# Patient Record
Sex: Female | Born: 1990 | Race: Black or African American | Hispanic: No | Smoking: Never smoker
Health system: Southern US, Community
[De-identification: ages and names within clinical notes are randomized; demographics above are authoritative.]

## PROBLEM LIST (undated history)

## (undated) DIAGNOSIS — F419 Anxiety disorder, unspecified: Secondary | ICD-10-CM

## (undated) DIAGNOSIS — J45909 Unspecified asthma, uncomplicated: Secondary | ICD-10-CM

## (undated) DIAGNOSIS — D649 Anemia, unspecified: Secondary | ICD-10-CM

## (undated) HISTORY — PX: CARDIAC SURGERY: SHX584

---

## 2013-04-08 ENCOUNTER — Emergency Department (HOSPITAL_COMMUNITY)
Admission: EM | Admit: 2013-04-08 | Discharge: 2013-04-09 | Disposition: A | Discharge status: Home / Self Care | Payer: Medicaid Other | Attending: Emergency Medicine | Admitting: Emergency Medicine

## 2013-04-08 ENCOUNTER — Encounter (HOSPITAL_COMMUNITY): Payer: Self-pay | Admitting: Emergency Medicine

## 2013-04-08 ENCOUNTER — Emergency Department (HOSPITAL_COMMUNITY): Payer: Medicaid Other

## 2013-04-08 DIAGNOSIS — R519 Headache, unspecified: Secondary | ICD-10-CM

## 2013-04-08 DIAGNOSIS — Z862 Personal history of diseases of the blood and blood-forming organs and certain disorders involving the immune mechanism: Secondary | ICD-10-CM | POA: Insufficient documentation

## 2013-04-08 DIAGNOSIS — B9789 Other viral agents as the cause of diseases classified elsewhere: Secondary | ICD-10-CM | POA: Insufficient documentation

## 2013-04-08 DIAGNOSIS — B349 Viral infection, unspecified: Secondary | ICD-10-CM

## 2013-04-08 DIAGNOSIS — Z3202 Encounter for pregnancy test, result negative: Secondary | ICD-10-CM | POA: Insufficient documentation

## 2013-04-08 DIAGNOSIS — G43909 Migraine, unspecified, not intractable, without status migrainosus: Secondary | ICD-10-CM | POA: Insufficient documentation

## 2013-04-08 DIAGNOSIS — R000 Tachycardia, unspecified: Secondary | ICD-10-CM | POA: Insufficient documentation

## 2013-04-08 DIAGNOSIS — Z8659 Personal history of other mental and behavioral disorders: Secondary | ICD-10-CM | POA: Insufficient documentation

## 2013-04-08 DIAGNOSIS — J111 Influenza due to unidentified influenza virus with other respiratory manifestations: Secondary | ICD-10-CM | POA: Insufficient documentation

## 2013-04-08 DIAGNOSIS — R55 Syncope and collapse: Secondary | ICD-10-CM | POA: Insufficient documentation

## 2013-04-08 DIAGNOSIS — R51 Headache: Secondary | ICD-10-CM

## 2013-04-08 DIAGNOSIS — R109 Unspecified abdominal pain: Secondary | ICD-10-CM | POA: Insufficient documentation

## 2013-04-08 DIAGNOSIS — M549 Dorsalgia, unspecified: Secondary | ICD-10-CM | POA: Insufficient documentation

## 2013-04-08 DIAGNOSIS — R6889 Other general symptoms and signs: Secondary | ICD-10-CM

## 2013-04-08 DIAGNOSIS — Z9889 Other specified postprocedural states: Secondary | ICD-10-CM | POA: Insufficient documentation

## 2013-04-08 DIAGNOSIS — J45901 Unspecified asthma with (acute) exacerbation: Secondary | ICD-10-CM | POA: Insufficient documentation

## 2013-04-08 HISTORY — DX: Unspecified asthma, uncomplicated: J45.909

## 2013-04-08 HISTORY — DX: Anxiety disorder, unspecified: F41.9

## 2013-04-08 HISTORY — DX: Anemia, unspecified: D64.9

## 2013-04-08 LAB — COMPREHENSIVE METABOLIC PANEL
ALT: 14 U/L (ref 0–35)
AST: 22 U/L (ref 0–37)
Albumin: 3.6 g/dL (ref 3.5–5.2)
Alkaline Phosphatase: 76 U/L (ref 39–117)
BUN: 9 mg/dL (ref 6–23)
CALCIUM: 9.1 mg/dL (ref 8.4–10.5)
CHLORIDE: 98 meq/L (ref 96–112)
CO2: 28 mEq/L (ref 19–32)
CREATININE: 0.73 mg/dL (ref 0.50–1.10)
GFR calc non Af Amer: >90 mL/min (ref >90)
GLUCOSE: 100 mg/dL — AB (ref 70–99)
Potassium: 3.5 mEq/L — ABNORMAL LOW (ref 3.7–5.3)
Sodium: 137 mEq/L (ref 137–147)
Total Bilirubin: 0.4 mg/dL (ref 0.3–1.2)
Total Protein: 8.2 g/dL (ref 6.0–8.3)

## 2013-04-08 LAB — URINALYSIS, ROUTINE W REFLEX MICROSCOPIC
Glucose, UA: NEGATIVE mg/dL
Hgb urine dipstick: NEGATIVE
Ketones, ur: 15 mg/dL — AB
Nitrite: NEGATIVE
Protein, ur: 30 mg/dL — AB
Specific Gravity, Urine: 1.023 (ref 1.005–1.030)
Urobilinogen, UA: 4 mg/dL — ABNORMAL HIGH (ref 0.0–1.0)
pH: 7.5 (ref 5.0–8.0)

## 2013-04-08 LAB — CBC WITH DIFFERENTIAL/PLATELET
BASOS PCT: 3 % — AB (ref 0–1)
Basophils Absolute: 0.2 10*3/uL — ABNORMAL HIGH (ref 0.0–0.1)
EOS PCT: 1 % (ref 0–5)
Eosinophils Absolute: 0.1 10*3/uL (ref 0.0–0.7)
HEMATOCRIT: 39.4 % (ref 36.0–46.0)
Hemoglobin: 13.4 g/dL (ref 12.0–15.0)
Lymphocytes Relative: 35 % (ref 12–46)
Lymphs Abs: 2.7 10*3/uL (ref 0.7–4.0)
MCH: 30.7 pg (ref 26.0–34.0)
MCHC: 34 g/dL (ref 30.0–36.0)
MCV: 90.4 fL (ref 78.0–100.0)
MONO ABS: 0.4 10*3/uL (ref 0.1–1.0)
MONOS PCT: 5 % (ref 3–12)
NEUTROS ABS: 4.2 10*3/uL (ref 1.7–7.7)
Neutrophils Relative %: 56 % (ref 43–77)
Platelets: 233 10*3/uL (ref 150–400)
RBC: 4.36 MIL/uL (ref 3.87–5.11)
RDW: 12 % (ref 11.5–15.5)
WBC: 7.6 10*3/uL (ref 4.0–10.5)

## 2013-04-08 LAB — PREGNANCY, URINE: Preg Test, Ur: NEGATIVE

## 2013-04-08 LAB — URINE MICROSCOPIC-ADD ON

## 2013-04-08 LAB — POCT I-STAT TROPONIN I: TROPONIN I, POC: 0 ng/mL (ref 0.00–0.08)

## 2013-04-08 LAB — CG4 I-STAT (LACTIC ACID): Lactic Acid, Venous: 1.68 mmol/L (ref 0.5–2.2)

## 2013-04-08 LAB — D-DIMER, QUANTITATIVE: D-Dimer, Quant: 2.66 ug/mL-FEU — ABNORMAL HIGH (ref 0.00–0.48)

## 2013-04-08 MED ORDER — METOCLOPRAMIDE HCL 5 MG/ML IJ SOLN
10.0000 mg | INTRAMUSCULAR | Status: AC
  Administered 2013-04-08: 10 mg via INTRAVENOUS
  Filled 2013-04-08: qty 2

## 2013-04-08 MED ORDER — SODIUM CHLORIDE 0.9 % IV BOLUS (SEPSIS): 1000.0000 mL | Freq: Once | INTRAVENOUS | Status: DC

## 2013-04-08 MED ORDER — LEVOFLOXACIN 750 MG PO TABS
750.0000 mg | ORAL_TABLET | Freq: Once | ORAL | Status: AC
  Administered 2013-04-09: 750 mg via ORAL
  Filled 2013-04-08: qty 1

## 2013-04-08 MED ORDER — IBUPROFEN 800 MG PO TABS
800.0000 mg | ORAL_TABLET | Freq: Once | ORAL | Status: AC
  Administered 2013-04-09: 800 mg via ORAL
  Filled 2013-04-08: qty 1

## 2013-04-08 MED ORDER — IOHEXOL 350 MG/ML SOLN
80.0000 mL | Freq: Once | INTRAVENOUS | Status: AC | PRN
  Administered 2013-04-08: 50 mL via INTRAVENOUS

## 2013-04-08 MED ORDER — SODIUM CHLORIDE 0.9 % IV BOLUS (SEPSIS)
1000.0000 mL | Freq: Once | INTRAVENOUS | Status: AC
  Administered 2013-04-08: 1000 mL via INTRAVENOUS

## 2013-04-08 MED ORDER — ACETAMINOPHEN 325 MG PO TABS
650.0000 mg | ORAL_TABLET | Freq: Once | ORAL | Status: AC
  Administered 2013-04-08: 650 mg via ORAL
  Filled 2013-04-08: qty 2

## 2013-04-08 NOTE — ED Provider Notes (Signed)
CSN: [id number]     Arrival date & time 04/08/13  2009 History   First MD Initiated Contact with Patient 04/08/13 2014     Chief Complaint  Patient presents with  . Chest Pain  . Headache  . Back Pain   (Consider location/radiation/quality/duration/timing/severity/associated sxs/prior Treatment) HPI Comments: Patient is a 23 year old female who is visiting from [place]. She presents to the emergency department today for multiple complaints. Patient states the symptoms began as lower abdominal cramping with nausea that was fleeting and resolved spontaneously. She states that she next began to feel dizzy and lightheaded with LOC lasting 1 minute. Patient's friend states she looked pale prior to losing consciousness and was lowered to the floor; no head trauma. Patient now c/o L sided migraine headache without thunderclap onset similar to past migraine headaches, a "tinging" sensation all over her body, and sharp intermittent chest pain radiating to her L thoracic back. Symptoms associated with NB/NB emesis x 1. Patient denies difficulty speaking or swallowing, neck pain/stiffness, hematemesis, melena, hematochezia, diarrhea, vaginal bleeding/discharge, dysuria, hematuria, and weakness.  Patient is a 23 y.o. female presenting with chest pain, headaches, and back pain. The history is provided by the patient. No language interpreter was used.  Chest Pain Associated symptoms: abdominal pain, back pain, headache, nausea, shortness of breath and vomiting   Associated symptoms: no dysphagia, no fever and no numbness   Headache Associated symptoms: abdominal pain, back pain, congestion, nausea and vomiting   Associated symptoms: no diarrhea, no fever and no numbness   Back Pain Associated symptoms: abdominal pain, chest pain and headaches   Associated symptoms: no dysuria, no fever and no numbness     Past Medical History  Diagnosis Date  . Asthma   . Anemia   . Anxiety    Past Surgical  History  Procedure Laterality Date  . Cardiac surgery      Pt states she had heart surgery at 63 months of age. Unable to tell what kind.   Family History  Problem Relation Age of Onset  . Stroke Mother   . Diabetes Sister    History  Substance Use Topics  . Smoking status: Never Smoker   . Smokeless tobacco: Not on file  . Alcohol Use: Yes     Comment: Occasionally   OB History   Grav Para Term Preterm Abortions TAB SAB Ect Mult Living   1 1        1      Review of Systems  Constitutional: Negative for fever.  HENT: Positive for congestion and rhinorrhea. Negative for trouble swallowing.   Eyes: Negative for visual disturbance.  Respiratory: Positive for shortness of breath.   Cardiovascular: Positive for chest pain.  Gastrointestinal: Positive for nausea, vomiting and abdominal pain. Negative for diarrhea and blood in stool.  Genitourinary: Negative for dysuria, hematuria, vaginal bleeding and vaginal discharge.  Musculoskeletal: Positive for back pain.  Neurological: Positive for syncope and headaches. Negative for numbness.  All other systems reviewed and are negative.    Allergies  Review of patient's allergies indicates no known allergies.  Home Medications   Current Outpatient Rx  Name  Route  Sig  Dispense  Refill  . ibuprofen (ADVIL,MOTRIN) 600 MG tablet   Oral   Take 1 tablet (600 mg total) by mouth every 6 (six) hours as needed.   30 tablet   0   . oseltamivir (TAMIFLU) 75 MG capsule   Oral   Take 1 capsule (75 mg total) by  mouth every 12 (twelve) hours.   10 capsule   0    BP 92/56  Pulse 88  Temp(Src) 100 F (37.8 C) (Rectal)  Resp 26  SpO2 99%  LMP 03/30/2013  Physical Exam  Nursing note and vitals reviewed. Constitutional: She is oriented to person, place, and time. She appears well-developed and well-nourished. No distress.  HENT:  Head: Normocephalic and atraumatic.  Mouth/Throat: Oropharynx is clear and moist. No oropharyngeal  exudate.  Eyes: Conjunctivae and EOM are normal. Pupils are equal, round, and reactive to light. No scleral icterus.  Neck: Normal range of motion. Neck supple.  No nuchal rigidity or meningismus  Cardiovascular: Regular rhythm and normal heart sounds.  Tachycardia present.   Tachycardia to 102  Pulmonary/Chest: Effort normal and breath sounds normal. No respiratory distress. She has no wheezes. She has no rales.  Abdominal: Soft. She exhibits no distension. There is no tenderness. There is no rebound and no guarding.  No peritoneal signs  Musculoskeletal: Normal range of motion.  Lymphadenopathy:    She has no cervical adenopathy.  Neurological: She is alert and oriented to person, place, and time. No cranial nerve deficit. She exhibits normal muscle tone.  GCS 15. Patient speaks in full goal oriented sentences. No cranial nerve deficits appreciated; symmetric eyebrow raise, no facial drooping, equal tongue protrusion and normal shoulder shrug. Patient moves extremities without ataxia. Equal grip strength bilaterally and no gross sensory deficits appreciated. Ambulatory with normal gait, but some resistance to move RLE; suspect poor effort.  Skin: Skin is warm and dry. No rash noted. She is not diaphoretic. No erythema. No pallor.  Psychiatric: She has a normal mood and affect. Her behavior is normal.    ED Course  Procedures (including critical care time) Labs Review Labs Reviewed  URINALYSIS, ROUTINE W REFLEX MICROSCOPIC - Abnormal; Notable for the following:    Color, Urine AMBER (*)    APPearance CLOUDY (*)    Bilirubin Urine SMALL (*)    Ketones, ur 15 (*)    Protein, ur 30 (*)    Urobilinogen, UA 4.0 (*)    Leukocytes, UA SMALL (*)    All other components within normal limits  D-DIMER, QUANTITATIVE - Abnormal; Notable for the following:    D-Dimer, Quant 2.66 (*)    All other components within normal limits  CBC WITH DIFFERENTIAL - Abnormal; Notable for the following:     Basophils Relative 3 (*)    Basophils Absolute 0.2 (*)    All other components within normal limits  COMPREHENSIVE METABOLIC PANEL - Abnormal; Notable for the following:    Potassium 3.5 (*)    Glucose, Bld 100 (*)    All other components within normal limits  URINE MICROSCOPIC-ADD ON - Abnormal; Notable for the following:    Squamous Epithelial / LPF MANY (*)    Bacteria, UA MANY (*)    All other components within normal limits  URINE CULTURE  PREGNANCY, URINE  CG4 I-STAT (LACTIC ACID)  POCT I-STAT TROPONIN I   Imaging Review Dg Chest 2 View  04/08/2013   CLINICAL DATA:  Shortness of breath.  EXAM: CHEST  2 VIEW  COMPARISON:  None.  FINDINGS: In the lateral projection, there is an ill-defined opacity which is not clearly explained on the frontal view, but presumably lingular. There is suggestion of air bronchograms. No effusion. Normal heart size. Negative osseous structures.  IMPRESSION: Equivocal infiltrate/pneumonia in the lingula.   Electronically Signed   By: Tiburcio Pea  M.D.   On: 04/08/2013 21:57   Ct Head Wo Contrast  04/08/2013   CLINICAL DATA:  Headache  EXAM: CT HEAD WITHOUT CONTRAST  TECHNIQUE: Contiguous axial images were obtained from the base of the skull through the vertex without intravenous contrast.  COMPARISON:  None.  FINDINGS: There is no acute intracranial hemorrhage or infarct. No mass lesion or midline shift. Gray-white matter differentiation is well maintained. Ventricles are normal in size without evidence of hydrocephalus. CSF containing spaces are within normal limits. No extra-axial fluid collection.  The calvarium is intact.  Orbital soft tissues are within normal limits.  The paranasal sinuses and mastoid air cells are well pneumatized and free of fluid.  Scalp soft tissues are unremarkable.  IMPRESSION: 1.  No acute intracranial process.  2.  Mild right maxillary sinus disease.   Electronically Signed   By: Rise MuBenjamin  McClintock M.D.   On: 04/08/2013 21:30    Ct Angio Chest Pe W/cm &/or Wo Cm  04/08/2013   CLINICAL DATA:  Chest pain.  Headache.  Back pain.  EXAM: CT ANGIOGRAPHY CHEST WITH CONTRAST  TECHNIQUE: Multidetector CT imaging of the chest was performed using the standard protocol during bolus administration of intravenous contrast. Multiplanar CT image reconstructions including MIPs were obtained to evaluate the vascular anatomy.  CONTRAST:  50mL OMNIPAQUE IOHEXOL 350 MG/ML IV.  COMPARISON:  No prior CT.  Two-view chest x-ray earlier same date.  FINDINGS: Contrast opacification of the pulmonary arteries is very good. No filling defects within either main pulmonary artery or their branches in either lung to suggest pulmonary embolism. Heart size normal. No pericardial effusion. Normal appearing thoracic and upper abdominal aorta. Widely patent proximal great vessels. Bovine aortic arch anatomy (left common carotid artery arising from the innominate artery).  Pulmonary parenchyma clear without localized airspace consolidation, interstitial disease, or parenchymal nodules or masses. No evidence of lingular pneumonia as questioned on the earlier chest imaging. No pleural effusions. Central airways patent without bronchial wall thickening.  Large amount of residual thymic tissue in the anterior superior mediastinum. No significant mediastinal, hilar, or axillary lymphadenopathy. Visualized thyroid gland unremarkable.  Visualized upper abdomen unremarkable for the early arterial phase of enhancement. Bone window images unremarkable.  Review of the MIP images confirms the above findings.  IMPRESSION: 1. No evidence of pulmonary embolism. 2. No acute cardiopulmonary disease. 3. Large amount of residual thymic tissue in the anterior superior mediastinum.   Electronically Signed   By: Hulan Saashomas  Lawrence M.D.   On: 04/08/2013 23:36    EKG Interpretation    Date/Time:  Monday April 08 2013 20:38:30 EST Ventricular Rate:  104 PR Interval:  152 QRS  Duration: 73 QT Interval:  309 QTC Calculation: 406 R Axis:   76 Text Interpretation:  Sinus tachycardia Right atrial enlargement Borderline T wave abnormalities No previous ECGs available Confirmed by Manus GunningANCOUR  MD, STEPHEN ([id number]) on 04/08/2013 8:51:30 PM            MDM   1. Flu-like symptoms   2. Viral illness   3. Headache    Uncomplicated flu like symptoms. Patient with sudden onset of a variety of symptoms including weakness, abdominal discomfort, vomiting x 1, chest discomfort, headache, shortness of breath, congestion, and rhinorrhea. Patient nontoxic appearing, hemodynamically stable, and febrile to 102.64F rectally. Labs a significant only for elevated d-dimer of 2.66 and urine consistent with contamination. No leukocytosis, anemia, or electrolyte imbalance. CXR with questionable infiltrate in the ligula; however CT study to evaluate for PE  without evidence of lingular pneumonia. Neurologic exam nonfocal and CT head without acute hemorrhage, hydrocephalus, mass lesion or midline shift. Abdomen soft and nontender; serial abdominal exams stable. No evidence of emergent intraabdominal process; no peritoneal signs.  Patient with overall improvement over ED course. Abdominal pain, chest pain, and headache completely resolved with IVF, tylenol, and ibuprofen. Fever responding to antipyretics. Symptoms most c/w viral/flu like illness. Patient stable for d/c with Rx for Tamiflu and Motrin. Return precautions discussed and patient agreeable to plan with no unaddressed concerns. Patient ambulated out of the ED without assistance and with normal gait, in NAD.   Filed Vitals:   04/08/13 2049 04/08/13 2200 04/08/13 2245 04/08/13 2309  BP:  93/53 92/56   Pulse: 106 100 88   Temp: 102.4 F (39.1 C)   100 F (37.8 C)  TempSrc: Rectal   Rectal  Resp: 26     SpO2: 100% 100% 99%       Antony Madura, PA-C 04/09/13 [phone]

## 2013-04-08 NOTE — ED Notes (Signed)
Per EMS, pt. orginally c/o chest pain which subsided, then c/o headache, then c/o abdominal pain, then c/o back pain.

## 2013-04-08 NOTE — ED Notes (Signed)
Lactic Acid results reported to Dr.Rancour 

## 2013-04-09 MED ORDER — IBUPROFEN 600 MG PO TABS: 600.0000 mg | ORAL_TABLET | Freq: Four times a day (QID) | ORAL | Status: AC | PRN

## 2013-04-09 MED ORDER — OSELTAMIVIR PHOSPHATE 75 MG PO CAPS: 75.0000 mg | ORAL_CAPSULE | Freq: Two times a day (BID) | ORAL | Status: AC

## 2013-04-09 MED ORDER — OSELTAMIVIR PHOSPHATE 75 MG PO CAPS
75.0000 mg | ORAL_CAPSULE | Freq: Once | ORAL | Status: AC
  Administered 2013-04-09: 75 mg via ORAL
  Filled 2013-04-09: qty 1

## 2013-04-09 NOTE — ED Provider Notes (Signed)
Medical screening examination/treatment/procedure(s) were performed by non-physician practitioner and as supervising physician I was immediately available for consultation/collaboration.  EKG Interpretation    Date/Time:  Monday April 08 2013 20:38:30 EST Ventricular Rate:  104 PR Interval:  152 QRS Duration: 73 QT Interval:  309 QTC Calculation: 406 R Axis:   76 Text Interpretation:  Sinus tachycardia Right atrial enlargement Borderline T wave abnormalities No previous ECGs available Confirmed by Manus GunningANCOUR  MD, Anshika Pethtel ([id number]) on 04/08/2013 8:51:30 PM             Glynn OctaveStephen Nomi Rudnicki, MD 04/09/13 [phone]

## 2013-04-09 NOTE — Discharge Instructions (Signed)
Take tamiflu as prescribed. Take ibuprofen for pain and body aches. Get plenty of rest and drink plenty of fluids. Return if symptoms worsen.  Influenza, Adult Influenza ("the flu") is a viral infection of the respiratory tract. It occurs more often in winter months because people spend more time in close contact with one another. Influenza can make you feel very sick. Influenza easily spreads from person to person (contagious). CAUSES  Influenza is caused by a virus that infects the respiratory tract. You can catch the virus by breathing in droplets from an infected person's cough or sneeze. You can also catch the virus by touching something that was recently contaminated with the virus and then touching your mouth, nose, or eyes. SYMPTOMS  Symptoms typically last 4 to 10 days and may include:  Fever.  Chills.  Headache, body aches, and muscle aches.  Sore throat.  Chest discomfort and cough.  Poor appetite.  Weakness or feeling tired.  Dizziness.  Nausea or vomiting. DIAGNOSIS  Diagnosis of influenza is often made based on your history and a physical exam. A nose or throat swab test can be done to confirm the diagnosis. RISKS AND COMPLICATIONS You may be at risk for a more severe case of influenza if you smoke cigarettes, have diabetes, have chronic heart disease (such as heart failure) or lung disease (such as asthma), or if you have a weakened immune system. Elderly people and pregnant women are also at risk for more serious infections. The most common complication of influenza is a lung infection (pneumonia). Sometimes, this complication can require emergency medical care and may be life-threatening. PREVENTION  An annual influenza vaccination (flu shot) is the best way to avoid getting influenza. An annual flu shot is now routinely recommended for all adults in the [place] TREATMENT  In mild cases, influenza goes away on its own. Treatment is directed at relieving symptoms. For  more severe cases, your caregiver may prescribe antiviral medicines to shorten the sickness. Antibiotic medicines are not effective, because the infection is caused by a virus, not by bacteria. HOME CARE INSTRUCTIONS  Only take over-the-counter or prescription medicines for pain, discomfort, or fever as directed by your caregiver.  Use a cool mist humidifier to make breathing easier.  Get plenty of rest until your temperature returns to normal. This usually takes 3 to 4 days.  Drink enough fluids to keep your urine clear or pale yellow.  Cover your mouth and nose when coughing or sneezing, and wash your hands well to avoid spreading the virus.  Stay home from work or school until your fever has been gone for at least 1 full day. SEEK MEDICAL CARE IF:   You have chest pain or a deep cough that worsens or produces more mucus.  You have nausea, vomiting, or diarrhea. SEEK IMMEDIATE MEDICAL CARE IF:   You have difficulty breathing, shortness of breath, or your skin or nails turn bluish.  You have severe neck pain or stiffness.  You have a severe headache, facial pain, or earache.  You have a worsening or recurring fever.  You have nausea or vomiting that cannot be controlled. MAKE SURE YOU:  Understand these instructions.  Will watch your condition.  Will get help right away if you are not doing well or get worse. Document Released: 03/11/2000 Document Revised: 09/13/2011 Document Reviewed: 06/13/2011 [facility] Patient Information 2014 [employer], [place].

## 2013-04-10 LAB — URINE CULTURE

## 2014-01-27 ENCOUNTER — Encounter (HOSPITAL_COMMUNITY): Payer: Self-pay | Admitting: Emergency Medicine

## 2014-10-29 IMAGING — CT CT HEAD W/O CM
1 of 2 series · 16 of 30 positions shown, 20 images · non-contrast
Comparison: None.

CLINICAL DATA: Headache

EXAM:
CT HEAD WITHOUT CONTRAST
TECHNIQUE: Contiguous axial images were obtained from the base of the skull
through the vertex without intravenous contrast.

[Series 3: head 2.0 h70h · axial · 0.39mm/px · z∈[-155,-21]mm · 16 of 75 slices shown, 20 images]
[im 4/75  brain]
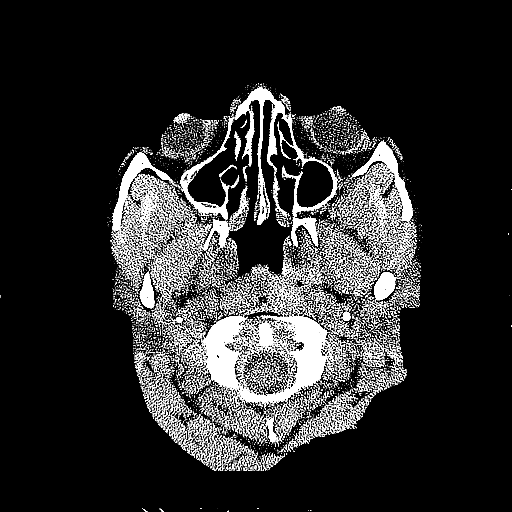
[im 4/75  bone]
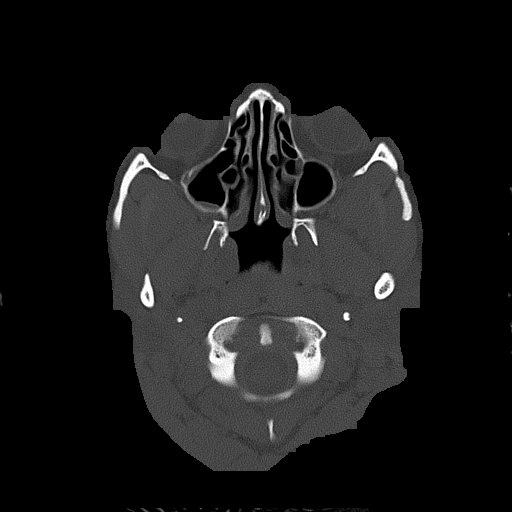
[im 8/75  brain]
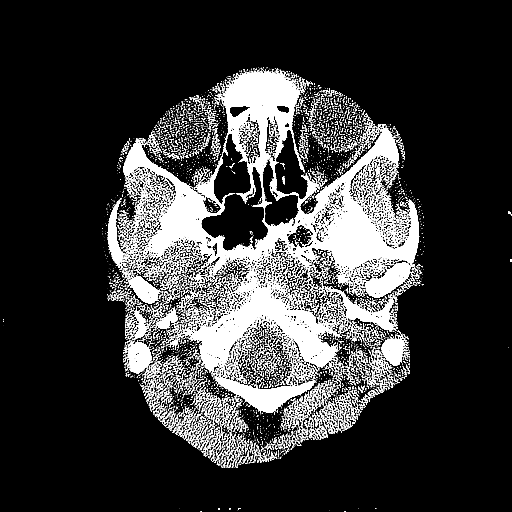
[im 12/75  brain]
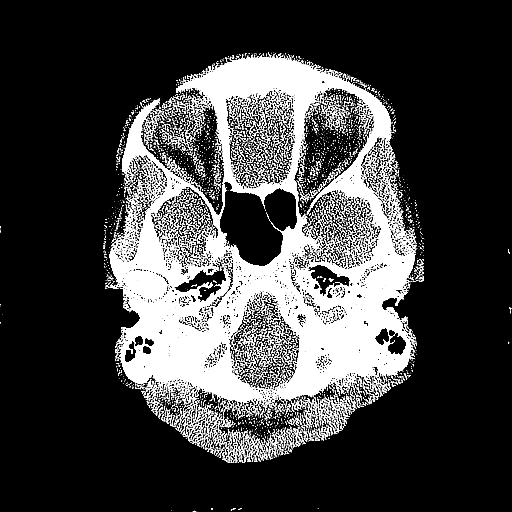
[im 19/75  brain]
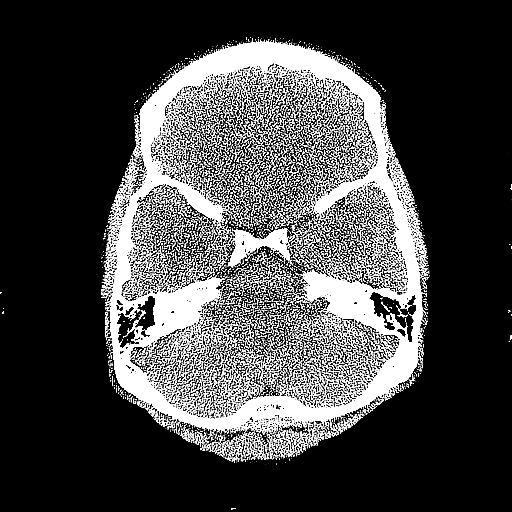
[im 23/75  brain]
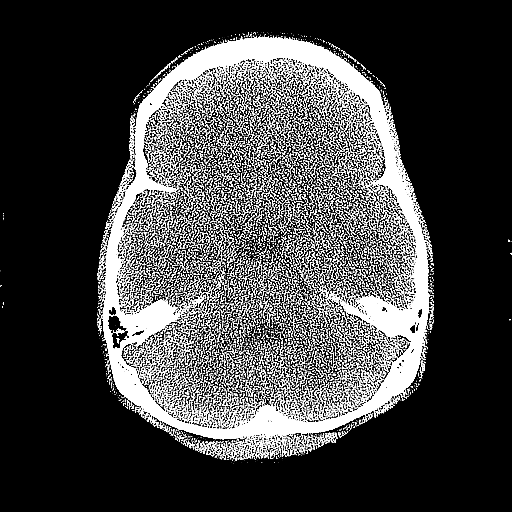
[im 23/75  bone]
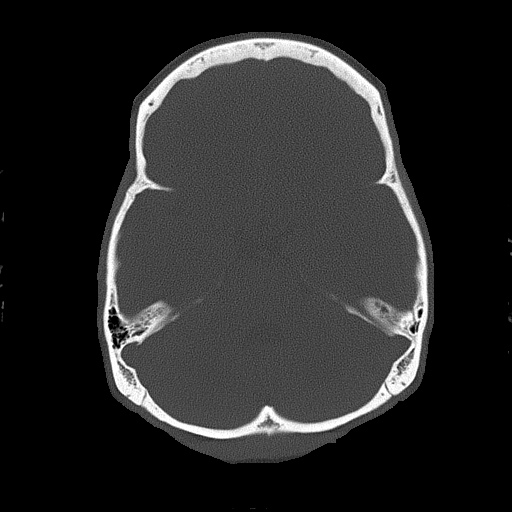
[im 26/75  brain]
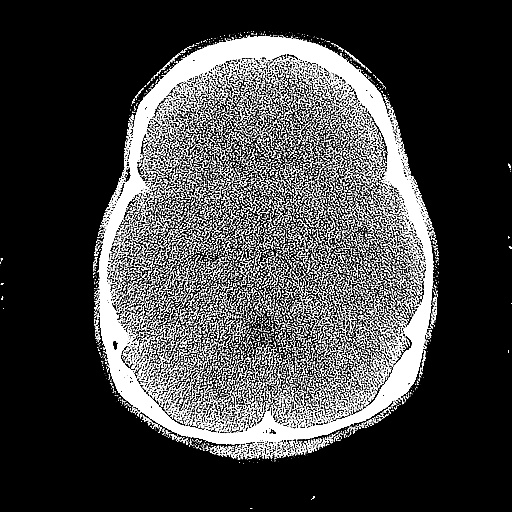
[im 30/75  brain]
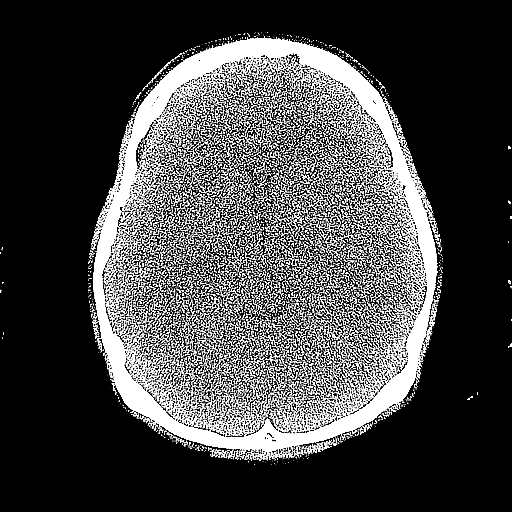
[im 34/75  brain]
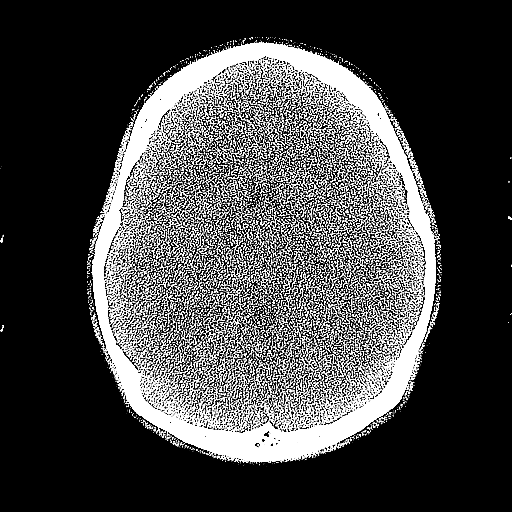
[im 41/75  brain]
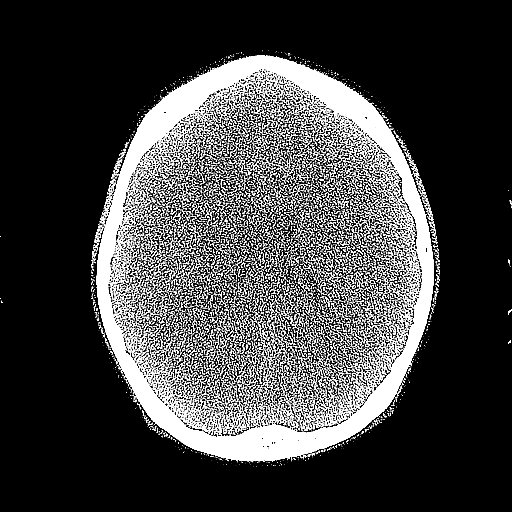
[im 41/75  bone]
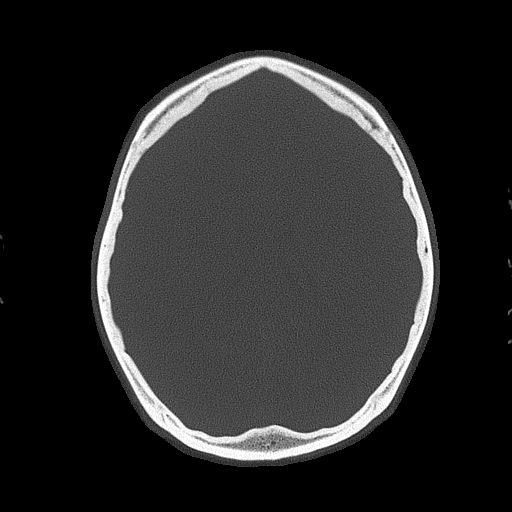
[im 45/75  brain]
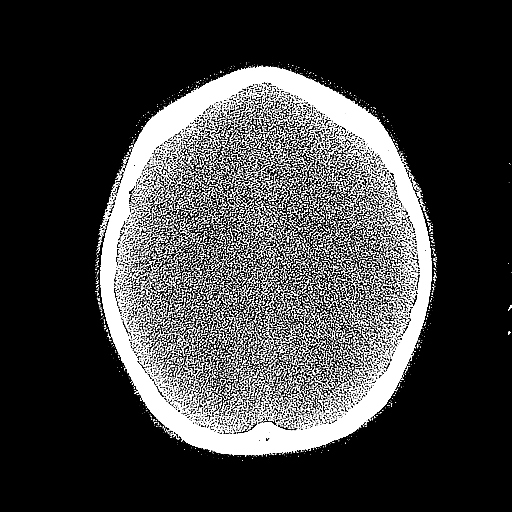
[im 49/75  brain]
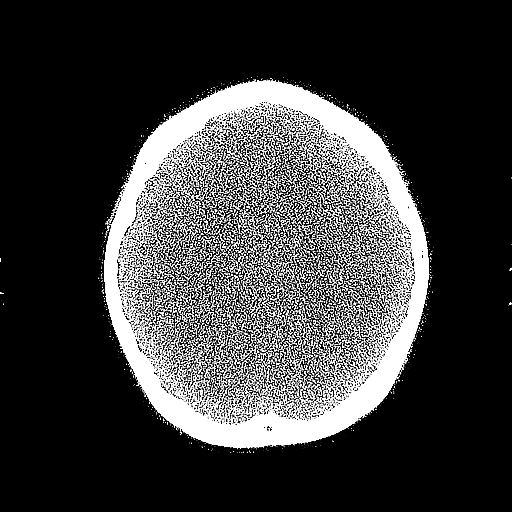
[im 52/75  brain]
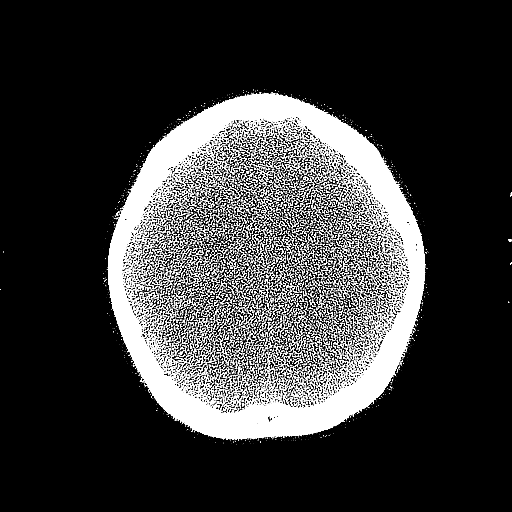
[im 56/75  brain]
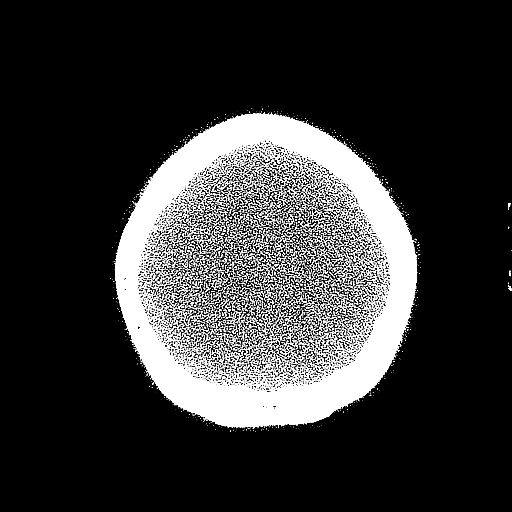
[im 56/75  bone]
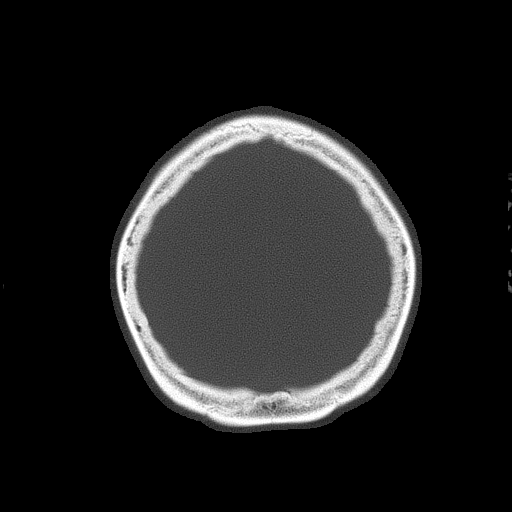
[im 63/75  brain]
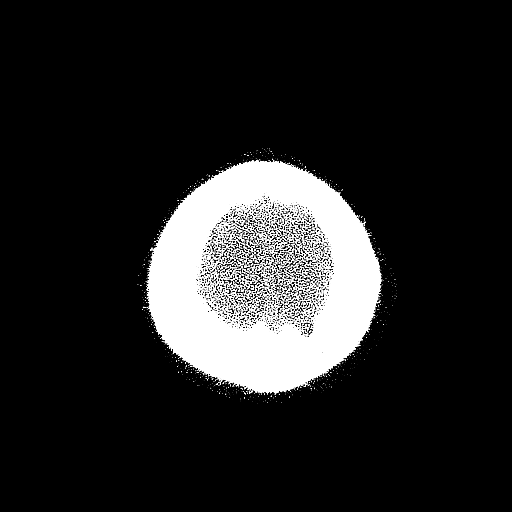
[im 67/75  brain]
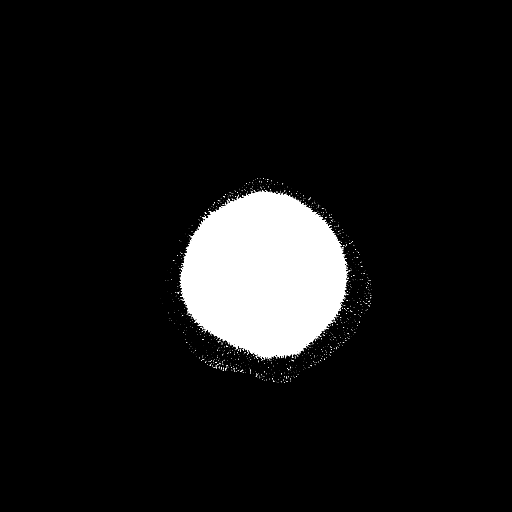
[im 71/75  brain]
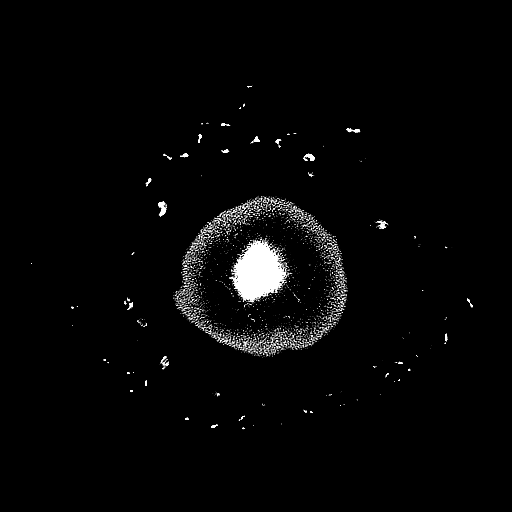

[16 of 30 positions shown; findings below may reference images not displayed]

FINDINGS: There is no acute intracranial hemorrhage or infarct. No mass lesion
or midline shift. Gray-white matter differentiation is well
maintained. Ventricles are normal in size without evidence of
hydrocephalus. CSF containing spaces are within normal limits. No
extra-axial fluid collection.

The calvarium is intact.

Orbital soft tissues are within normal limits.

The paranasal sinuses and mastoid air cells are well pneumatized and
free of fluid.

Scalp soft tissues are unremarkable.
IMPRESSION: 1.  No acute intracranial process.

2.  Mild right maxillary sinus disease.

## 2014-10-29 IMAGING — CR DG CHEST 2V
2 series · 2 of 2 positions shown · non-contrast
Comparison: None.

CLINICAL DATA: Shortness of breath.

EXAM:
CHEST  2 VIEW

[w chest pa]
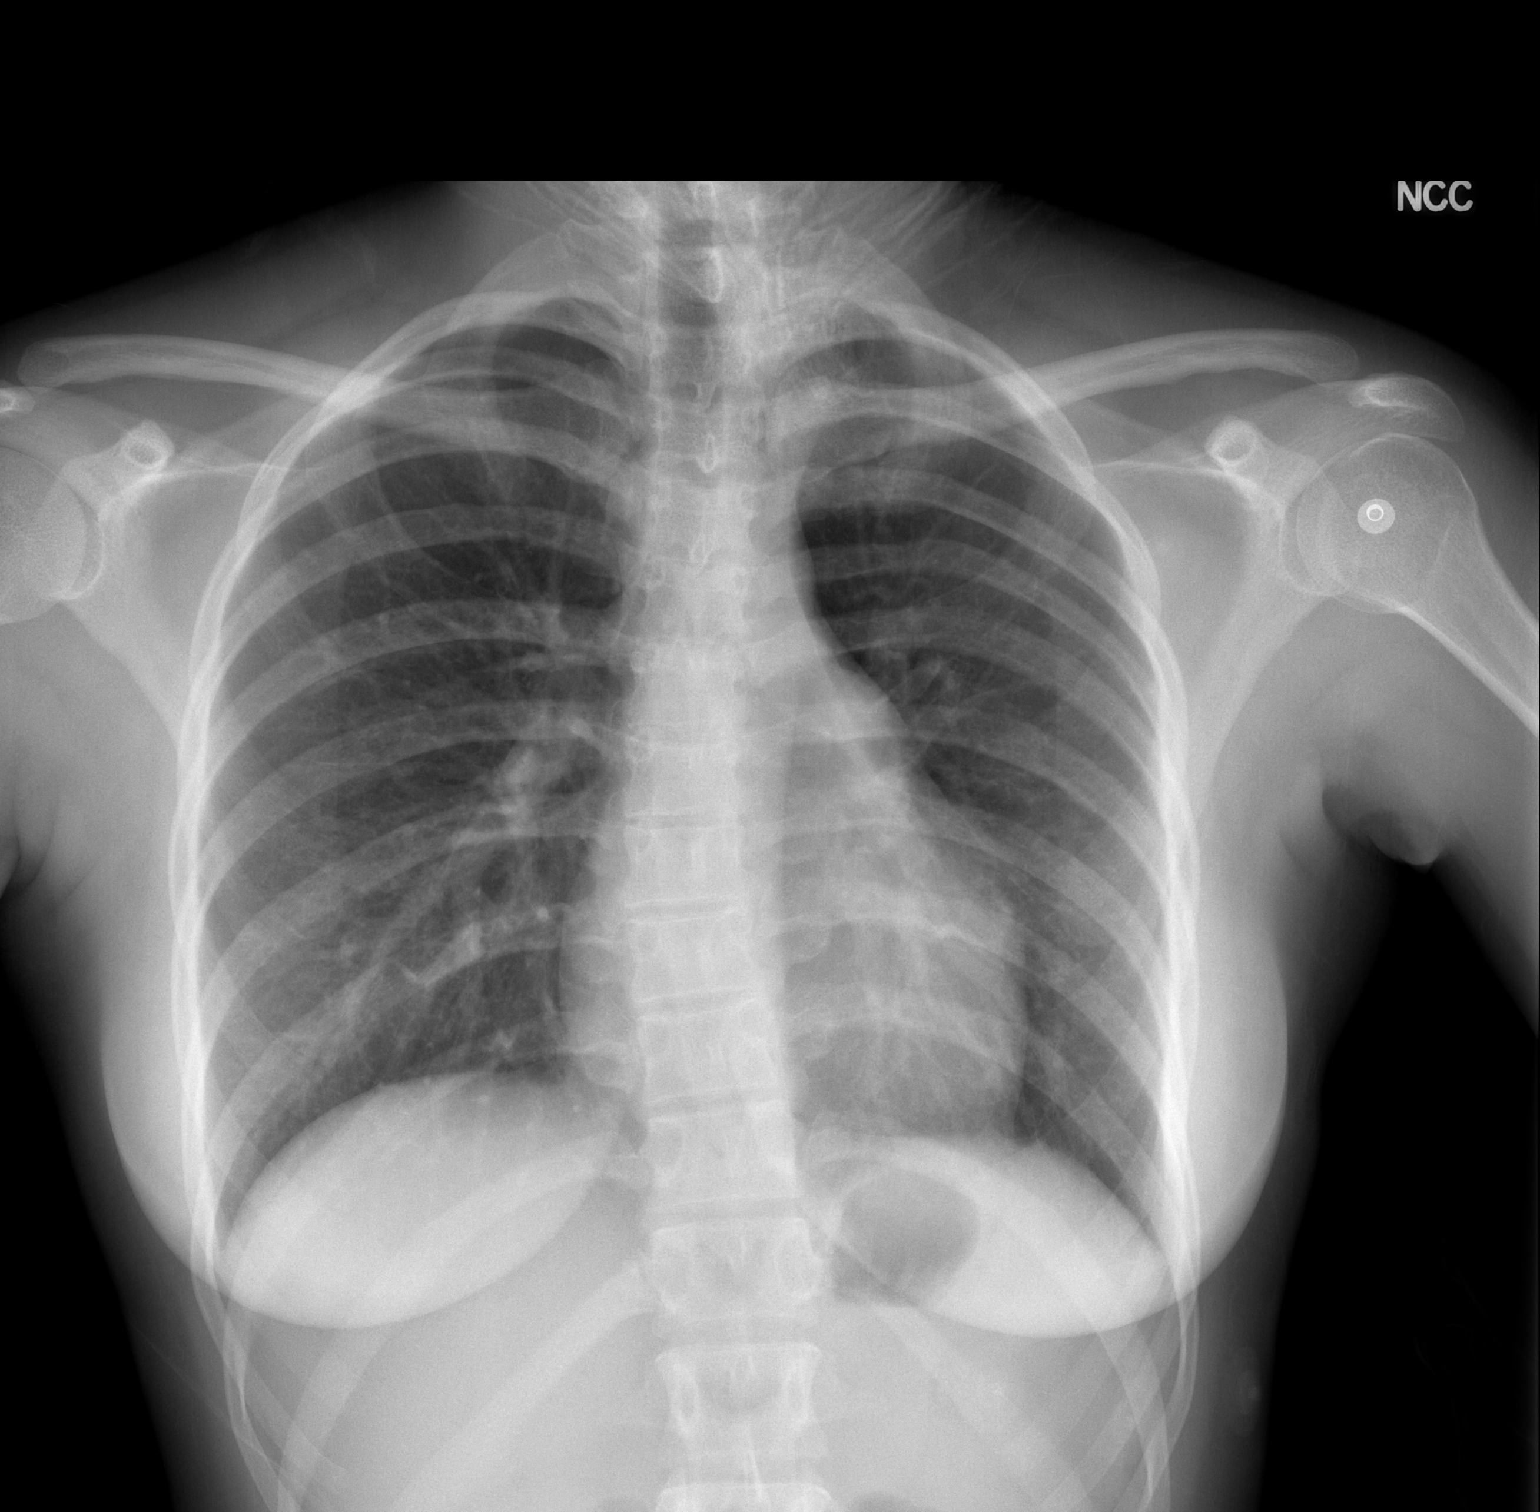

[w chest lat]
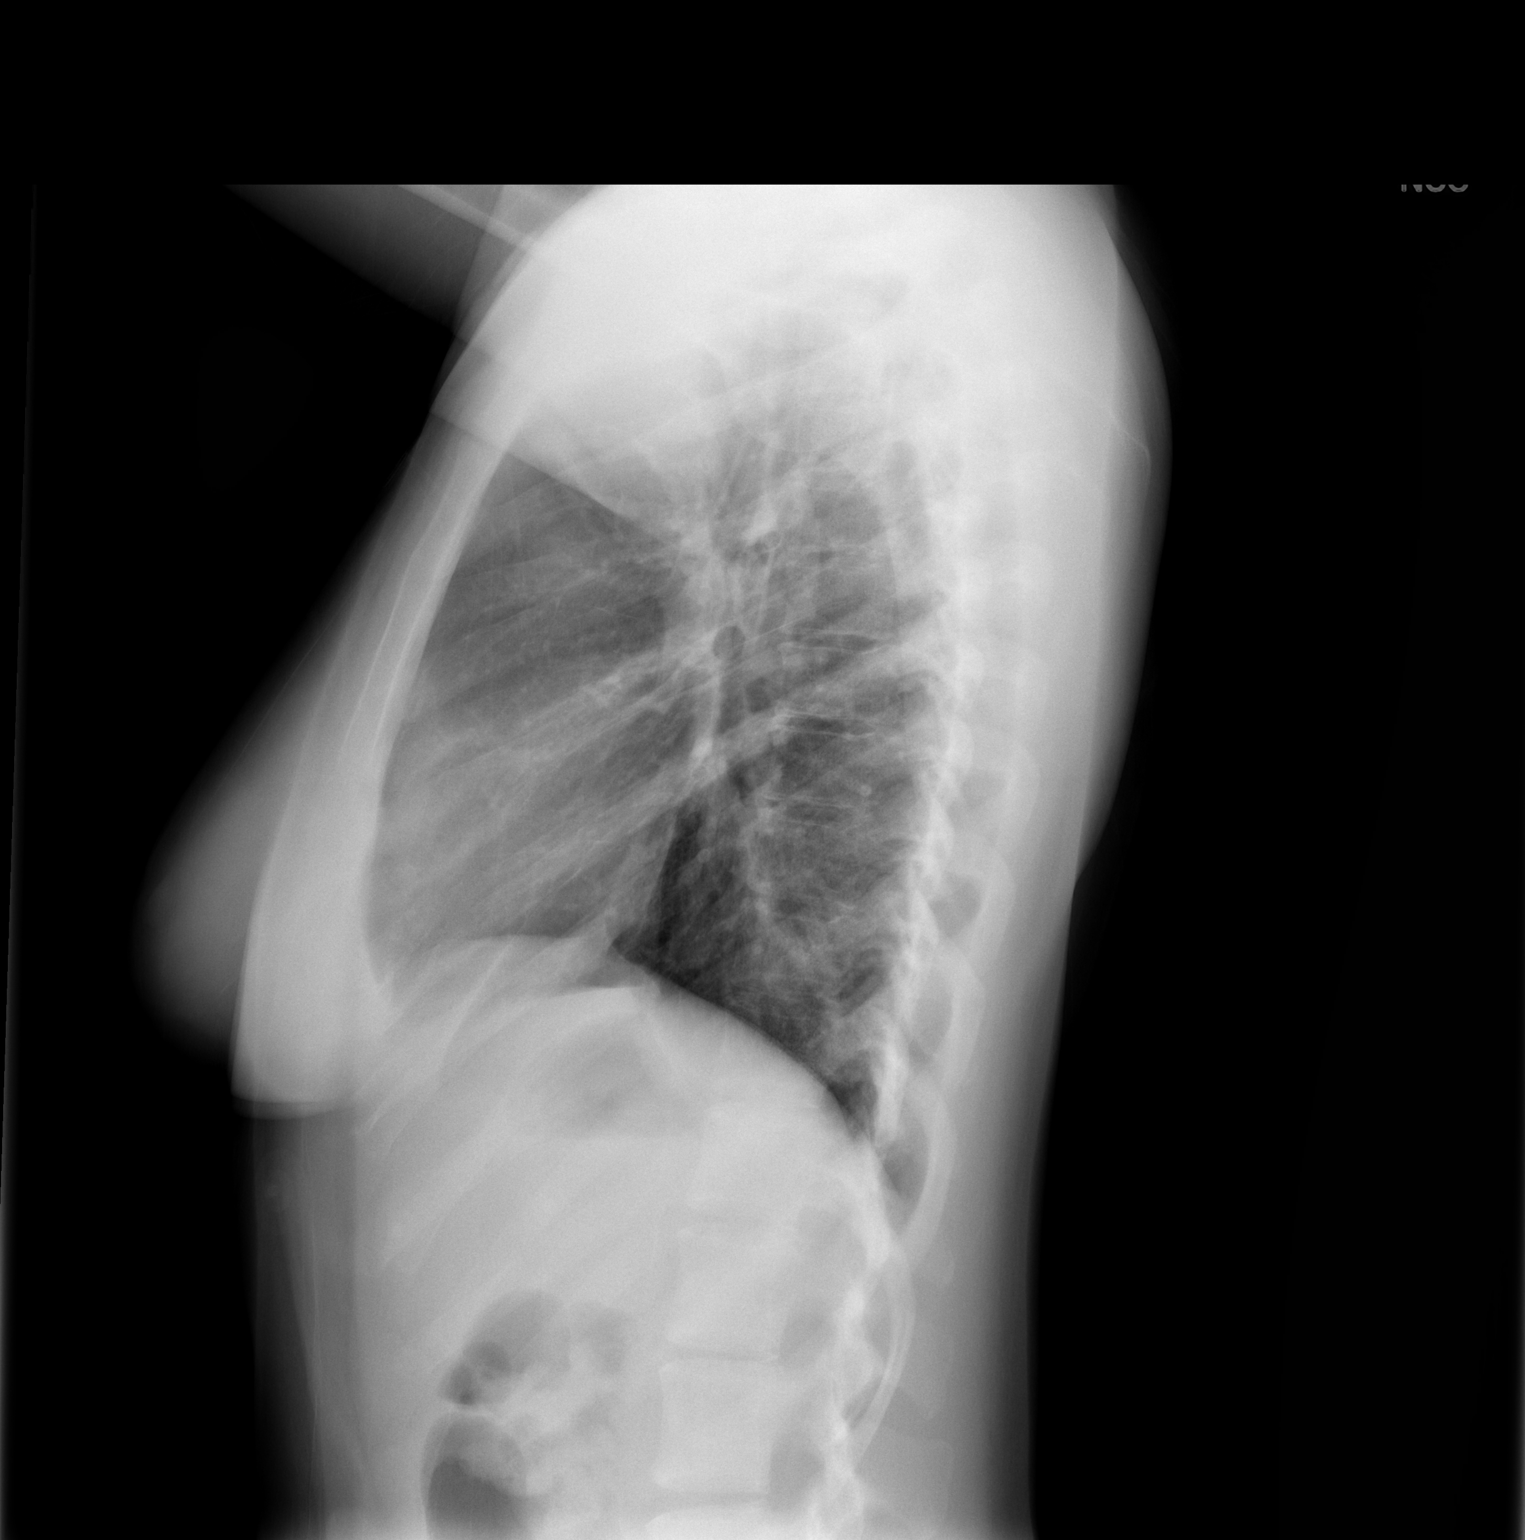

[2 of 2 positions shown; findings below may reference images not displayed]

FINDINGS: In the lateral projection, there is an ill-defined opacity which is
not clearly explained on the frontal view, but presumably lingular.
There is suggestion of air bronchograms. No effusion. Normal heart
size. Negative osseous structures.
IMPRESSION: Equivocal infiltrate/pneumonia in the lingula.
